# Patient Record
Sex: Female | Born: 2005 | Race: Black or African American | Hispanic: No | Marital: Single | Smoking: Never smoker
Health system: Southern US, Community
[De-identification: ages and names within clinical notes are randomized; demographics above are authoritative.]

---
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
## 2024-03-29 DIAGNOSIS — L720 Epidermal cyst: Secondary | ICD-10-CM | POA: Diagnosis not present
